# Patient Record
Sex: Male | Born: 1975 | Race: White | Hispanic: No | Marital: Single
Health system: Southern US, Community
[De-identification: ages and names within clinical notes are randomized; demographics above are authoritative.]

---

## 2002-04-02 ENCOUNTER — Encounter: Payer: Self-pay | Admitting: Orthopedic Surgery

## 2002-04-02 ENCOUNTER — Emergency Department (HOSPITAL_COMMUNITY): Admission: EM | Admit: 2002-04-02 | Discharge: 2002-04-02 | Payer: Self-pay | Admitting: Emergency Medicine

## 2003-11-04 ENCOUNTER — Emergency Department: Payer: Self-pay | Admitting: Emergency Medicine

## 2011-01-12 ENCOUNTER — Emergency Department: Payer: Self-pay | Admitting: Emergency Medicine

## 2011-08-27 ENCOUNTER — Emergency Department: Payer: Self-pay | Admitting: Emergency Medicine

## 2011-08-27 LAB — URINALYSIS, COMPLETE
Bilirubin,UR: NEGATIVE
Blood: NEGATIVE
Glucose,UR: NEGATIVE mg/dL (ref 0–75)
Leukocyte Esterase: NEGATIVE
Nitrite: NEGATIVE
Ph: 5 (ref 4.5–8.0)
Protein: NEGATIVE
RBC,UR: 2 /HPF (ref 0–5)
Specific Gravity: 1.029 (ref 1.003–1.030)
Squamous Epithelial: NONE SEEN
WBC UR: <1 /HPF (ref 0–5)

## 2011-08-27 LAB — CBC
HCT: 42.2 % (ref 40.0–52.0)
HGB: 14.9 g/dL (ref 13.0–18.0)
MCH: 30 pg (ref 26.0–34.0)
MCHC: 35.2 g/dL (ref 32.0–36.0)
MCV: 85 fL (ref 80–100)
Platelet: 124 10*3/uL — ABNORMAL LOW (ref 150–440)
RBC: 4.96 10*6/uL (ref 4.40–5.90)
RDW: 13.9 % (ref 11.5–14.5)
WBC: 9.4 10*3/uL (ref 3.8–10.6)

## 2011-08-27 LAB — COMPREHENSIVE METABOLIC PANEL
Albumin: 3.9 g/dL (ref 3.4–5.0)
Alkaline Phosphatase: 108 U/L (ref 50–136)
Anion Gap: 8 (ref 7–16)
BUN: 21 mg/dL — ABNORMAL HIGH (ref 7–18)
Bilirubin,Total: 0.5 mg/dL (ref 0.2–1.0)
Calcium, Total: 9.1 mg/dL (ref 8.5–10.1)
Chloride: 100 mmol/L (ref 98–107)
Co2: 27 mmol/L (ref 21–32)
Creatinine: 0.99 mg/dL (ref 0.60–1.30)
EGFR (African American): >60
EGFR (Non-African Amer.): >60
Glucose: 92 mg/dL (ref 65–99)
Osmolality: 273 (ref 275–301)
Potassium: 3.7 mmol/L (ref 3.5–5.1)
SGOT(AST): 114 U/L — ABNORMAL HIGH (ref 15–37)
SGPT (ALT): 132 U/L — ABNORMAL HIGH (ref 12–78)
Sodium: 135 mmol/L — ABNORMAL LOW (ref 136–145)
Total Protein: 7.8 g/dL (ref 6.4–8.2)

## 2011-08-27 LAB — LIPASE, BLOOD: Lipase: 64 U/L — ABNORMAL LOW (ref 73–393)

## 2014-01-14 IMAGING — CR DG CHEST 2V
1 series · 2 of 2 positions shown · non-contrast
Comparison: none

REASON FOR EXAM: stat; left rib pain
COMMENTS:

PROCEDURE:     DXR - DXR CHEST PA (OR AP) AND LATERAL  - August 27, 2011  [DATE]
RESULT:     The lungs are clear. The heart and pulmonary vessels are normal.
The bony and mediastinal structures are unremarkable. There is no effusion.
There is no pneumothorax or evidence of congestive failure.

[Series 1: w chest pa · 0.14mm/px · 2 of 2 slices shown]
[im 1/2]
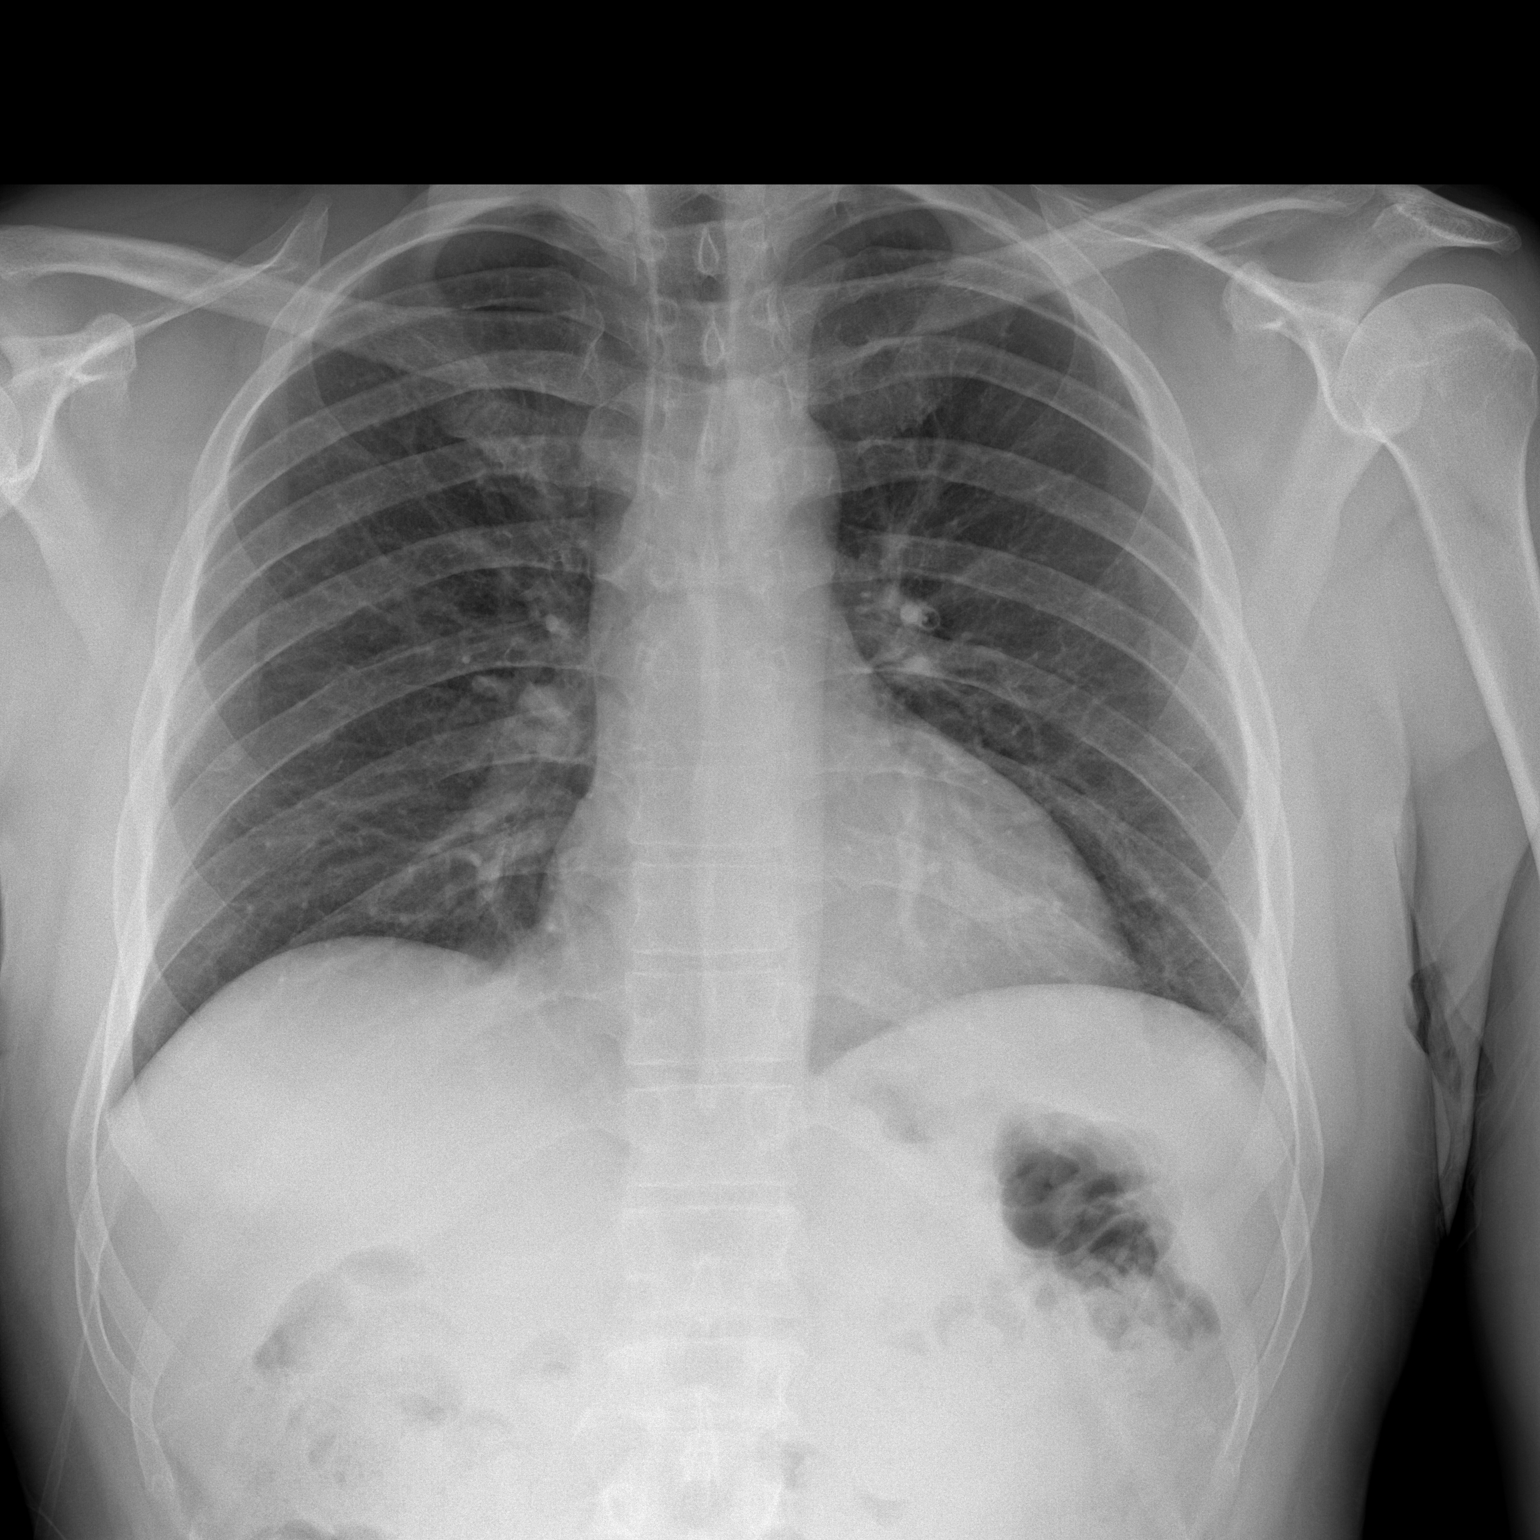
[im 2/2]
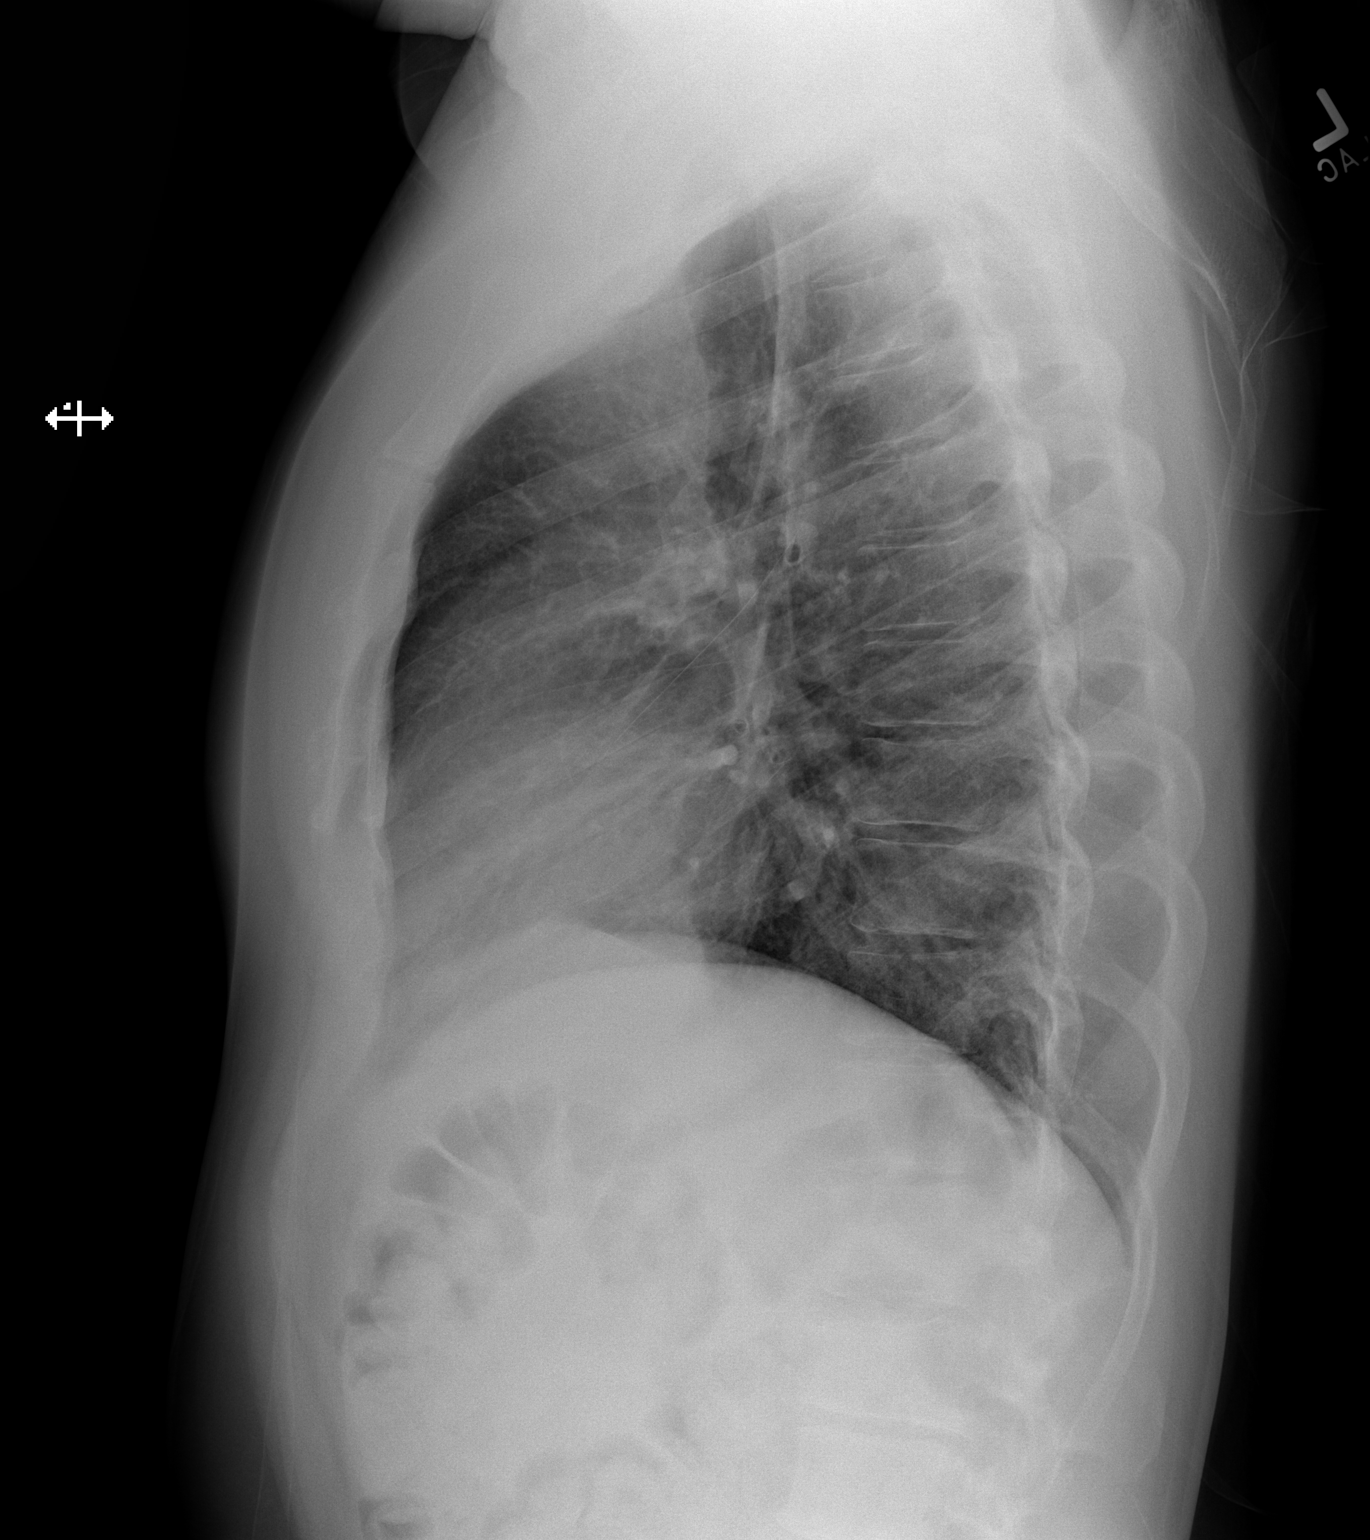

[2 of 2 positions shown; findings below may reference images not displayed]

IMPRESSION: No acute cardiopulmonary disease.

[REDACTED]

## 2014-01-14 IMAGING — CT CT CHEST W/ CM
2 series · 15 of 31 positions shown, 19 images · IV contrast (APPLIED)
Comparison: none

REASON FOR EXAM: sob, pleurisy
COMMENTS:

PROCEDURE:     CT  - CT CHEST (FOR PE) W  - August 27, 2011  [DATE]
RESULT:     Chest CT dated 08/27/2011.
TECHNIQUE: Helical 3 mm sections were obtained from the thoracic inlet
through lung bases status post intravenous administration of 100 mL of
Osovue-5NU.

[Series 4: soft tissue · axial · 0.74mm/px · z∈[-598,-550]mm · 2 of 102 slices shown]
[im 8/102  mediastinal]
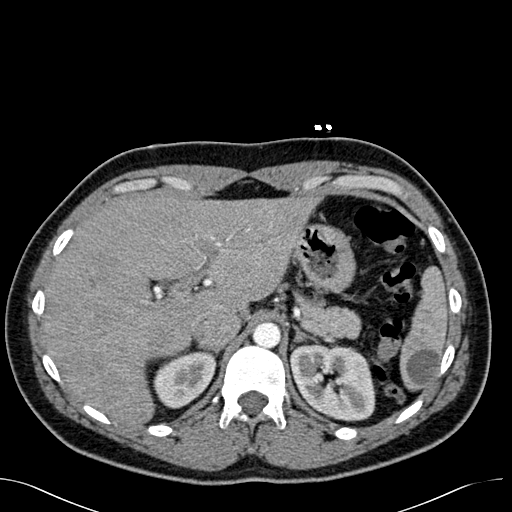
[im 24/102  mediastinal]
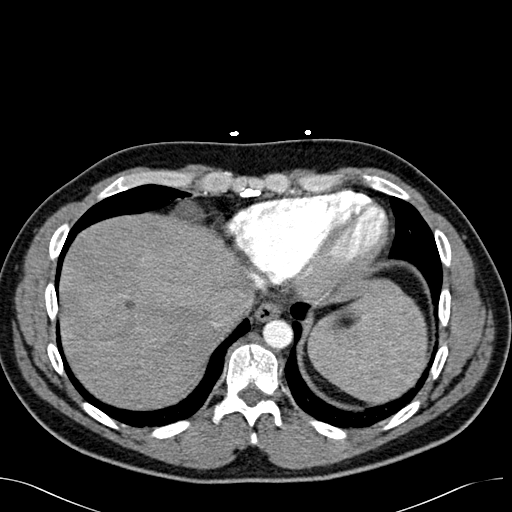

[Series 5: lung windows · axial · 0.74mm/px · z∈[-588,-340]mm · 13 of 99 slices shown, 17 images]
[im 8/99  mediastinal]
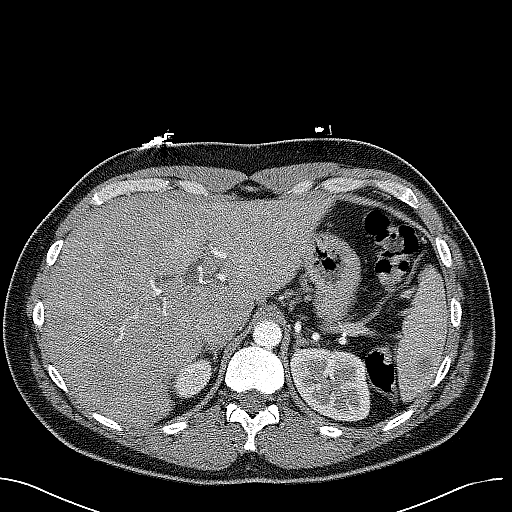
[im 8/99  lung]
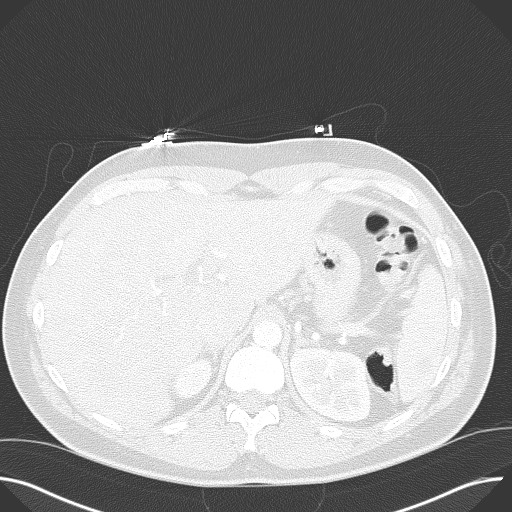
[im 16/99  lung]
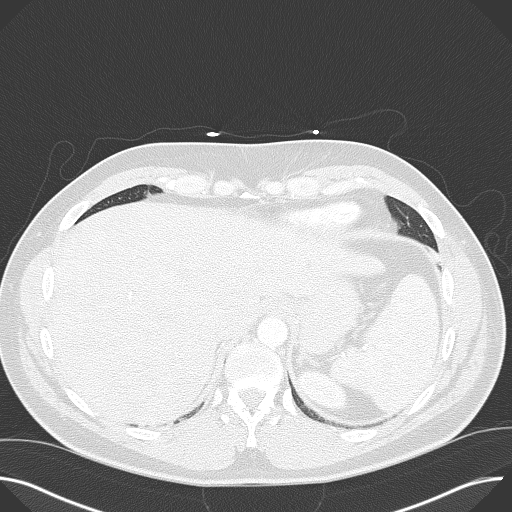
[im 23/99  lung]
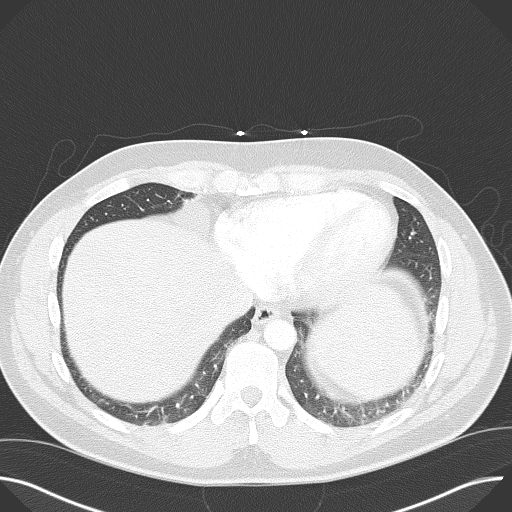
[im 31/99  lung]
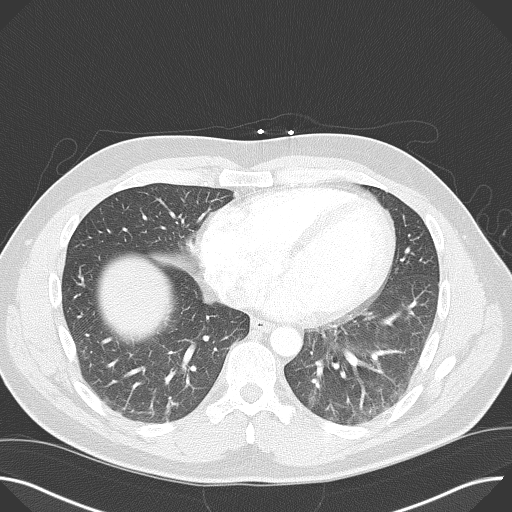
[im 38/99  mediastinal]
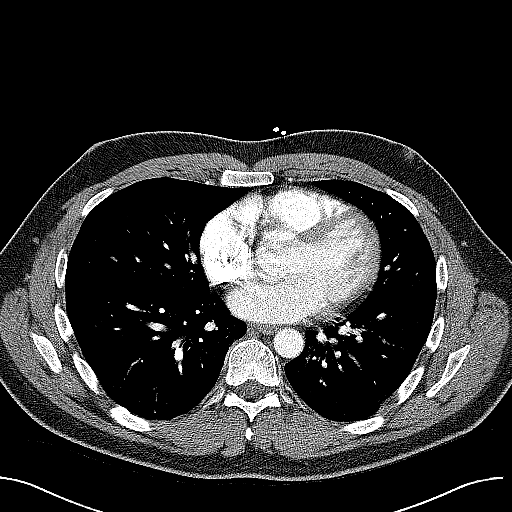
[im 38/99  lung]
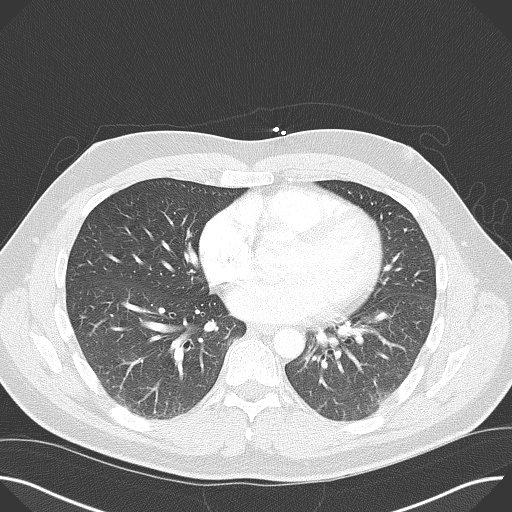
[im 46/99  lung]
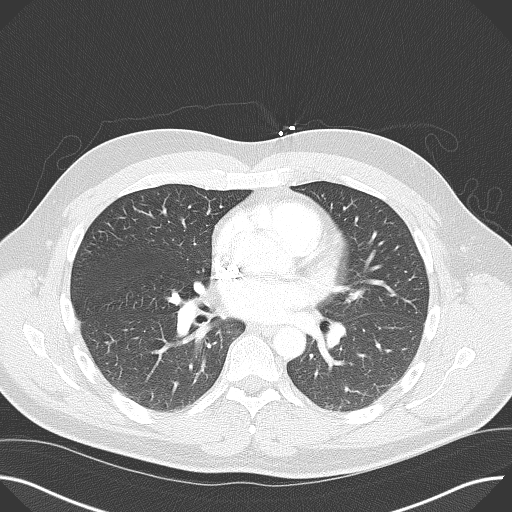
[im 50/99  lung]
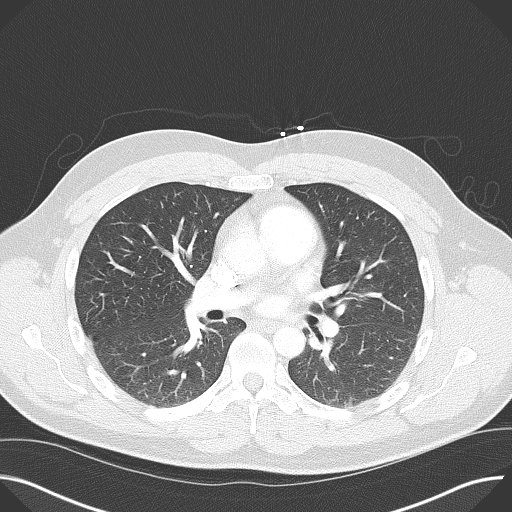
[im 53/99  lung]
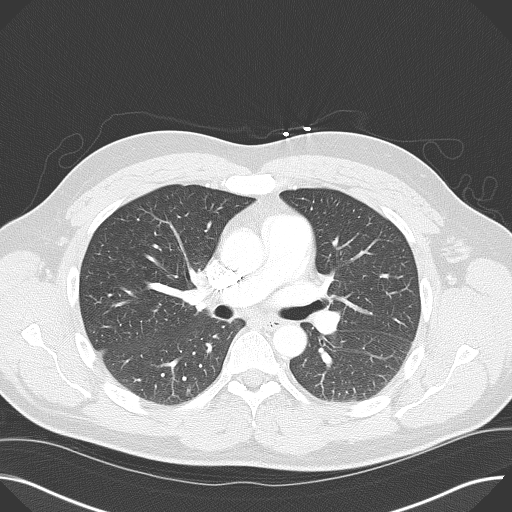
[im 61/99  mediastinal]
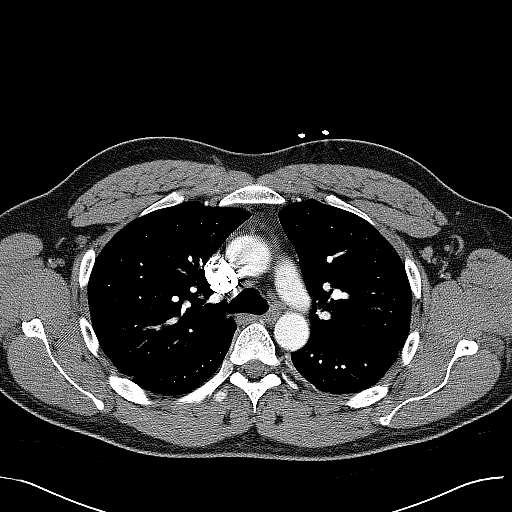
[im 61/99  lung]
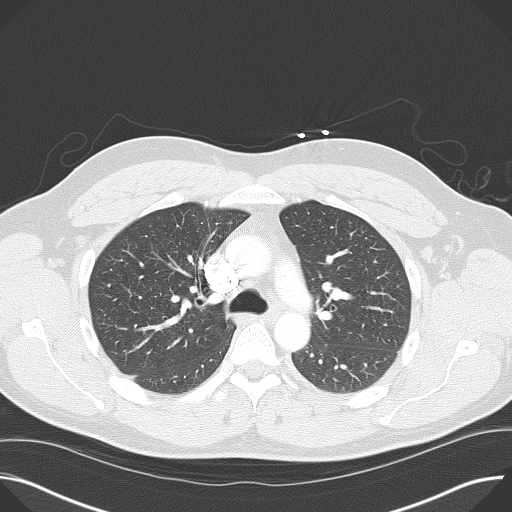
[im 68/99  lung]
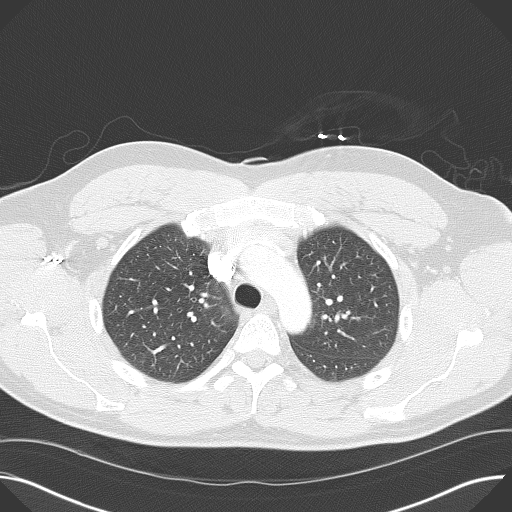
[im 76/99  lung]
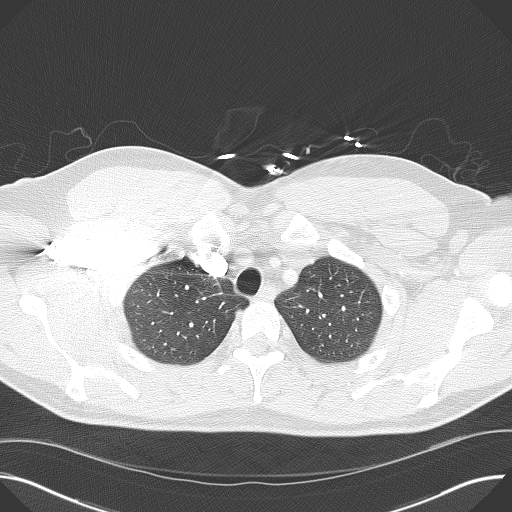
[im 83/99  lung]
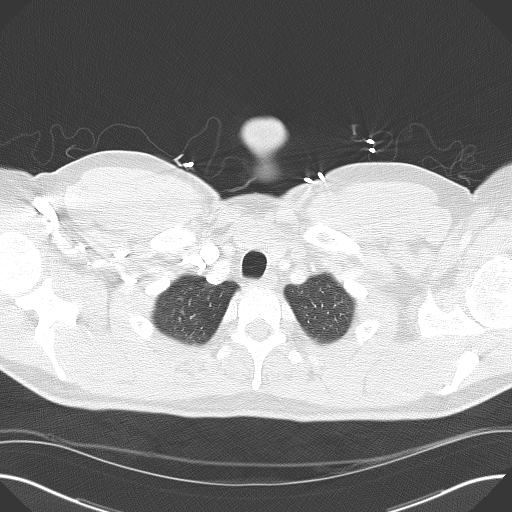
[im 91/99  mediastinal]
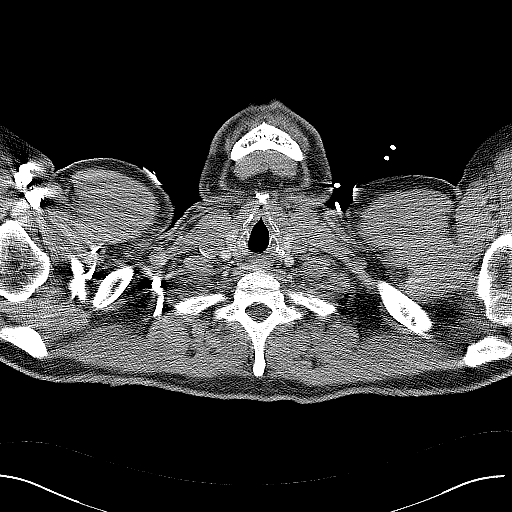
[im 91/99  lung]
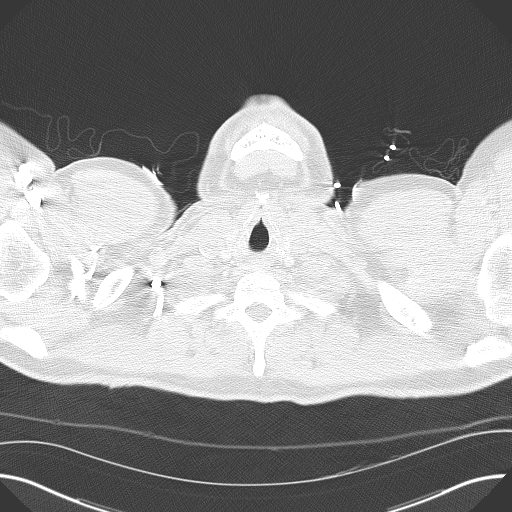

[15 of 31 positions shown; findings below may reference images not displayed]

FINDINGS: The mediastinum and hilar regions and structures demonstrate no
evidence of mediastinal nor hilar adenopathy nor masses. There is no
evidence of filling defects within the main, lobar, or segmental pulmonary
arteries. The lung parenchyma demonstrates hypoventilation within the lung
bases are otherwise unremarkable.

The visualized upper abdominal viscera and she's heterogeneous enhancement
pattern involving the liver component of which is due to phase of contrast
administration. There findings concerning for possible low attenuating mass
within the lateral segment left lobe of the liver and possibly within the
medial segment. Clinically warranted further evaluation with dash rolandic
consultation and possibly further imaging a triphasic CT and/or MRI is
recommended. Many visualized upper abdomen viscera are unremarkable.
IMPRESSION: No CT evidence of pulmonary are showing embolic disease.
2. Hypoventilation within the lung bases.
3. Indeterminate findings within the liver clinical correlation recommended.
4. Note not mentioned above small low attenuating 7 to 8 mm size foci
identified within the right and left lobes of the liver these may represents
cyst or possibly small hemangiomas or possibly even biliary hamartomas.
Ominous etiologies cannot be excluded.

## 2019-11-06 ENCOUNTER — Ambulatory Visit: Payer: Self-pay

## 2019-11-06 NOTE — Progress Notes (Deleted)
Sleep Medicine   Office Visit  Patient Name: Jeremy Pena DOB: 11/25/1975 MRN [id number]    Chief Complaint: ***  Brief History:  Ashur presents with a *** history of ***. This is *** in the *** position.   Sleep quality is ***. This is noted *** nights. The patient's bed partner reports  *** at night. The patient relates the following symptoms: *** are also present. The patient goes to sleep at *** and wakes up at ***.  he reports that his sleep quality is ***.  Sleep quality is *** when outside home environment.  Patient has noted *** of his legs at night.  The patient  relates *** behavior during the night.  The patient *** a history of psychiatric problems. The Epworth Sleepiness Score is *** out of 24 .  The patient relates  Cardiovascular risk factors include: *** The patient reports ***    ROS  General: (-) fever, (-) chills, (-) night sweat Nose and Sinuses: (-) nasal stuffiness or itchiness, (-) postnasal drip, (-) nosebleeds, (-) sinus trouble. Mouth and Throat: (-) sore throat, (-) hoarseness. Neck: (-) swollen glands, (-) enlarged thyroid, (-) neck pain. Respiratory: *** cough, *** shortness of breath, *** wheezing. Neurologic: *** numbness, *** tingling. Psychiatric: *** anxiety, *** depression Sleep behavior: ***sleep paralysis ***hypnogogic hallucinations ***dream enactment      ***vivid dreams ***cataplexy ***night terrors ***sleep walking   Current Medication: No outpatient encounter medications on file as of 11/06/2019.   No facility-administered encounter medications on file as of 11/06/2019.    Surgical History: *** The histories are not reviewed yet. Please review them in the "History" navigator section and refresh this SmartLink.  Medical History: No past medical history on file.  Family History: Non contributory to the present illness  Social History: Social History   Socioeconomic History  . Marital status: Single    Spouse name: Not on  file  . Number of children: Not on file  . Years of education: Not on file  . Highest education level: Not on file  Occupational History  . Not on file  Tobacco Use  . Smoking status: Not on file  Substance and Sexual Activity  . Alcohol use: Not on file  . Drug use: Not on file  . Sexual activity: Not on file  Other Topics Concern  . Not on file  Social History Narrative  . Not on file   Social Determinants of Health   Financial Resource Strain:   . Difficulty of Paying Living Expenses: Not on file  Food Insecurity:   . Worried About Programme researcher, broadcasting/film/video in the Last Year: Not on file  . Ran Out of Food in the Last Year: Not on file  Transportation Needs:   . Lack of Transportation (Medical): Not on file  . Lack of Transportation (Non-Medical): Not on file  Physical Activity:   . Days of Exercise per Week: Not on file  . Minutes of Exercise per Session: Not on file  Stress:   . Feeling of Stress : Not on file  Social Connections:   . Frequency of Communication with Friends and Family: Not on file  . Frequency of Social Gatherings with Friends and Family: Not on file  . Attends Religious Services: Not on file  . Active Member of Clubs or Organizations: Not on file  . Attends Banker Meetings: Not on file  . Marital Status: Not on file  Intimate Partner Violence:   . Fear of  Current or Ex-Partner: Not on file  . Emotionally Abused: Not on file  . Physically Abused: Not on file  . Sexually Abused: Not on file    Vital Signs: There were no vitals taken for this visit.  Examination: General Appearance: The patient is well-developed, well-nourished, and in no distress. Neck Circumference: *** Skin: Gross inspection of skin unremarkable. Head: normocephalic, no gross deformities. Eyes: no gross deformities noted. ENT: ears appear grossly normal Neurologic: Alert and oriented. No involuntary movements.    EPWORTH SLEEPINESS SCALE:  Scale:  (0)= no  chance of dozing; (1)= slight chance of dozing; (2)= moderate chance of dozing; (3)= high chance of dozing  Chance  Situtation    Sitting and reading: ***    Watching TV: ***    Sitting Inactive in public: ***    As a passenger in car: ***      Lying down to rest: ***    Sitting and talking: ***    Sitting quielty after lunch: ***    In a car, stopped in traffic: ***   TOTAL SCORE:   *** out of 24    SLEEP STUDIES:  1. ***   LABS: No results found for this or any previous visit (from the past 2160 hour(s)).  Radiology: DG Chest 2 View  Result Date: 08/27/2011 **** PRIOR REPORT IMPORTED FROM AN EXTERNAL SYSTEM **** PRIOR REPORT IMPORTED FROM THE [facility] WORKFLOW SYSTEM REASON FOR EXAM:    stat; left rib pain COMMENTS: PROCEDURE:     DXR - DXR CHEST PA (OR AP) AND LATERAL  - Aug 27 2011  6:10AM RESULT:     The lungs are clear. The heart and pulmonary vessels are normal. The bony and mediastinal structures are unremarkable. There is no effusion. There is no pneumothorax or evidence of congestive failure. IMPRESSION:      No acute cardiopulmonary disease. Dictation Site: 2    CT Chest W Contrast  Result Date: 08/27/2011 **** PRIOR REPORT IMPORTED FROM AN EXTERNAL SYSTEM **** PRIOR REPORT IMPORTED FROM THE [facility] WORKFLOW SYSTEM REASON FOR EXAM:    sob, pleurisy COMMENTS: PROCEDURE:     CT  - CT CHEST (FOR PE) W  - Aug 27 2011  9:26AM RESULT:     Chest CT dated 08/27/2011. Technique: Helical 3 mm sections were obtained from the thoracic inlet through lung bases status post intravenous administration of 100 mL of Isovue-370. Findings: The mediastinum and hilar regions and structures demonstrate no evidence of mediastinal nor hilar adenopathy nor masses. There is no evidence of filling defects within the main, lobar, or segmental pulmonary arteries. The lung parenchyma demonstrates hypoventilation within the lung bases are otherwise unremarkable. The visualized upper abdominal viscera and  she's heterogeneous enhancement pattern involving the liver component of which is due to phase of contrast administration. There findings concerning for possible low attenuating mass within the lateral segment left lobe of the liver and possibly within the medial segment. Clinically warranted further evaluation with dash rolandic consultation and possibly further imaging a triphasic CT and/or MRI is recommended. Many visualized upper abdomen viscera are unremarkable. IMPRESSION:      No CT evidence of pulmonary are showing embolic disease. 2. Hypoventilation within the lung bases. 3. Indeterminate findings within the liver clinical correlation recommended. 4. Note not mentioned above small low attenuating 7 to 8 mm size foci identified within the right and left lobes of the liver these may represents cyst or possibly small hemangiomas or possibly even  biliary hamartomas. Ominous etiologies cannot be excluded.     No results found.  No results found.    Assessment and Plan: There are no problems to display for this patient.    PLAN OSA:   Patient evaluation suggests high risk of sleep disordered breathing due to *** Patient has comorbid cardiovascular risk factors including: *** which could be exacerbated by pathologic sleep-disordered breathing.  Suggest: *** to assess/treat the patient's sleep disordered breathing. The patient was also counselled on *** to optimize sleep health.  PLAN hypersomnia:  Patient evaluation suggests significant daytime hypersomnia.  The Epworth Sleepiness Score is elevated at *** out of 24. Patient *** drowsy driving. The patient *** MVA due to sleepiness.  The patient *** restless leg symptoms which exacerbate *** for *** nights per week. The patient *** periodic limb movements which exacerbate ***  for *** nights per week. Suggest: ***  Also suggest ***  PLAN insomnia:  Patient evaluation suggests *** insomnia. This is a chronic disorder. This has been a  concern for *** and causes impaired daytime functioning. The patient exhibits comorbid ***  The history *** suggest the insomnia predates the use of hypnotic medications. The symptoms *** with the discontinuation of these medications. There is no obvious medical, psychiatric or pharmacologic abuse issues ot account for the insomnia.  Treatment recommendations include: *** The patient should maintain a sleep log and calculate total sleep time for 1-2 weeks. Set bed and wake times for achieve 85% sleep efficiency for one week. Once this is achieved  time in bed can be gradually increased. A pharmacologic treatment approach would include a trial of *** for the next ***  months. During this time the patient is to maintain a sleep diary to track progress.    1. ***  General Counseling: I have discussed the findings of the evaluation and examination with Chrissie Noa.  I have also discussed any further diagnostic evaluation thatmay be needed or ordered today. Garvin verbalizes understanding of the findings of todays visit. We also reviewed his medications today and discussed drug interactions and side effects including but not limited excessive drowsiness and altered mental states. We also discussed that there is always a risk not just to him but also people around him. he has been encouraged to call the office with any questions or concerns that should arise related to todays visit.  No orders of the defined types were placed in this encounter.       I have personally obtained a history, evaluated the patient, evaluated pertinent data, formulated the assessment and plan and placed orders.   Valentino Hue Sol Blazing, PhD, FAASM  Diplomate, American Board of Sleep Medicine    Yevonne Pax, MD [facility] Diplomate ABMS Pulmonary and Critical Care Medicine Sleep medicine
